# Patient Record
Sex: Male | Born: 1970 | Race: White | Hispanic: No | Marital: Single
Health system: Southern US, Community
[De-identification: ages and names within clinical notes are randomized; demographics above are authoritative.]

---

## 2010-08-07 ENCOUNTER — Emergency Department: Payer: Self-pay | Admitting: Emergency Medicine

## 2011-12-10 ENCOUNTER — Emergency Department: Payer: Self-pay | Admitting: Emergency Medicine

## 2012-03-16 ENCOUNTER — Inpatient Hospital Stay: Payer: Self-pay | Admitting: Specialist

## 2012-03-16 LAB — CBC
MCHC: 33.6 g/dL (ref 32.0–36.0)
Platelet: 289 10*3/uL (ref 150–440)
WBC: 9.4 10*3/uL (ref 3.8–10.6)

## 2012-03-16 LAB — URINALYSIS, COMPLETE
Bilirubin,UR: NEGATIVE
Blood: NEGATIVE
Leukocyte Esterase: NEGATIVE
Nitrite: NEGATIVE
Ph: 7 (ref 4.5–8.0)
Protein: NEGATIVE
Specific Gravity: 1.017 (ref 1.003–1.030)
Squamous Epithelial: NONE SEEN

## 2012-03-16 LAB — COMPREHENSIVE METABOLIC PANEL
Anion Gap: 9 (ref 7–16)
BUN: 9 mg/dL (ref 7–18)
Bilirubin,Total: 0.4 mg/dL (ref 0.2–1.0)
Calcium, Total: 8.9 mg/dL (ref 8.5–10.1)
Chloride: 105 mmol/L (ref 98–107)
EGFR (African American): >60
EGFR (Non-African Amer.): >60
SGOT(AST): 23 U/L (ref 15–37)
SGPT (ALT): 32 U/L
Total Protein: 7.2 g/dL (ref 6.4–8.2)

## 2012-03-16 LAB — TROPONIN I: Troponin-I: 0.03 ng/mL

## 2012-03-16 LAB — CK TOTAL AND CKMB (NOT AT ARMC)
CK, Total: 159 U/L (ref 35–232)
CK-MB: 0.7 ng/mL (ref 0.5–3.6)
CK-MB: 0.5 ng/mL (ref 0.5–3.6)

## 2012-03-17 LAB — HEMOGLOBIN A1C: Hemoglobin A1C: 5.7 % (ref 4.2–6.3)

## 2012-03-17 LAB — LIPID PANEL
Cholesterol: 160 mg/dL (ref 0–200)
HDL Cholesterol: 56 mg/dL (ref 40–60)
Ldl Cholesterol, Calc: 84 mg/dL (ref 0–100)
Triglycerides: 100 mg/dL (ref 0–200)
VLDL Cholesterol, Calc: 20 mg/dL (ref 5–40)

## 2012-03-17 LAB — CK TOTAL AND CKMB (NOT AT ARMC)
CK, Total: 135 U/L (ref 35–232)
CK-MB: 0.5 ng/mL (ref 0.5–3.6)

## 2012-03-17 LAB — GLUCOSE, RANDOM: Glucose: 95 mg/dL (ref 65–99)

## 2013-03-16 IMAGING — US US CAROTID DUPLEX BILAT
1 series · 14 of 24 positions shown · non-contrast
Comparison: none

REASON FOR EXAM: perioral and tongue numbness, ?mini-strokes
COMMENTS:

[Series 1: us carotid duplex bilat · 14 of 52 slices shown]
[im 1/52]
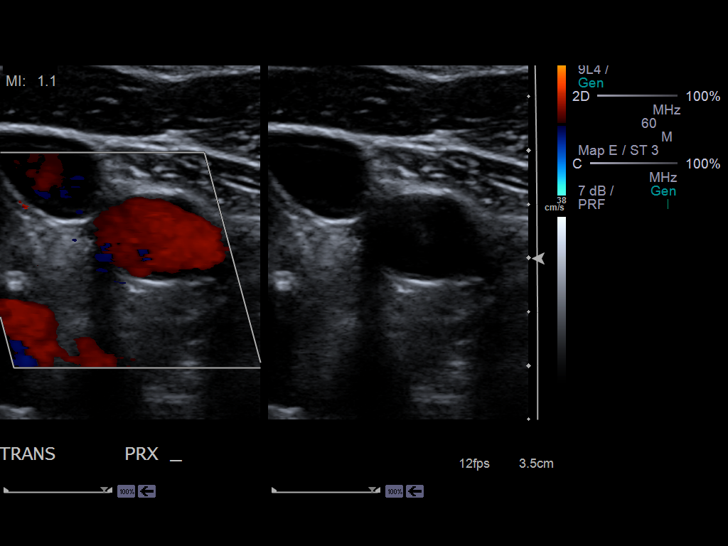
[im 5/52]
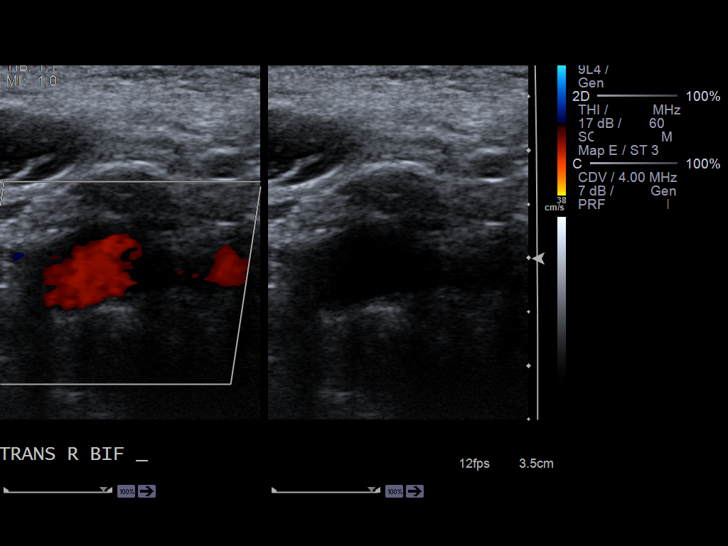
[im 9/52]
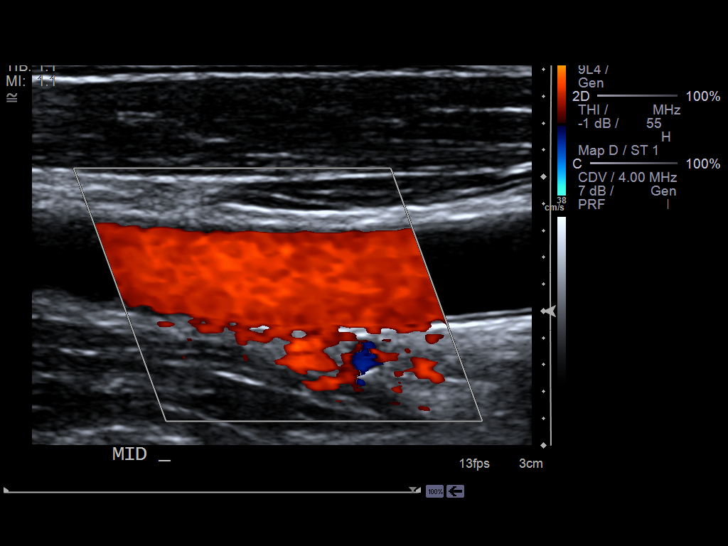
[im 14/52]
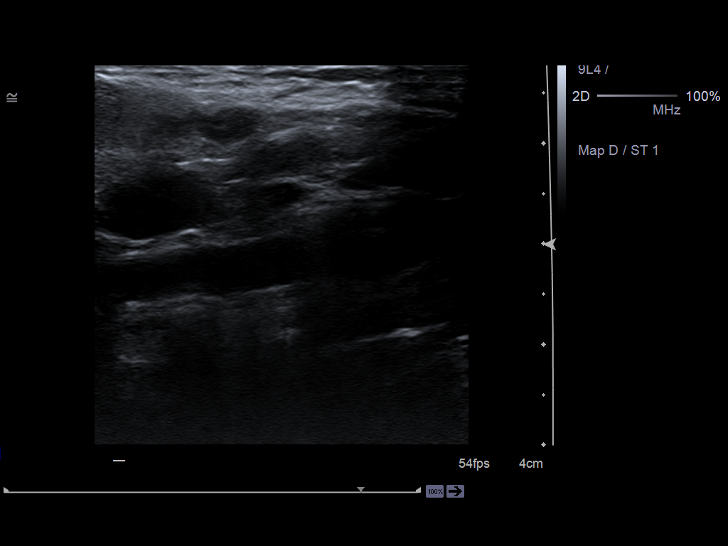
[im 16/52]
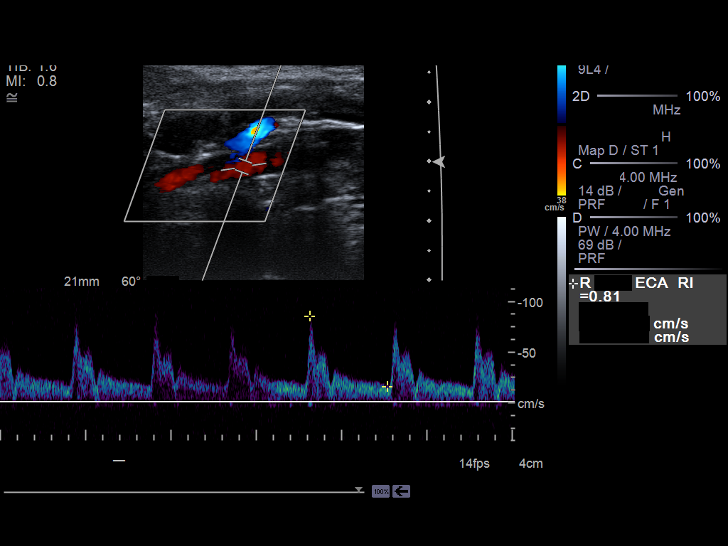
[im 20/52]
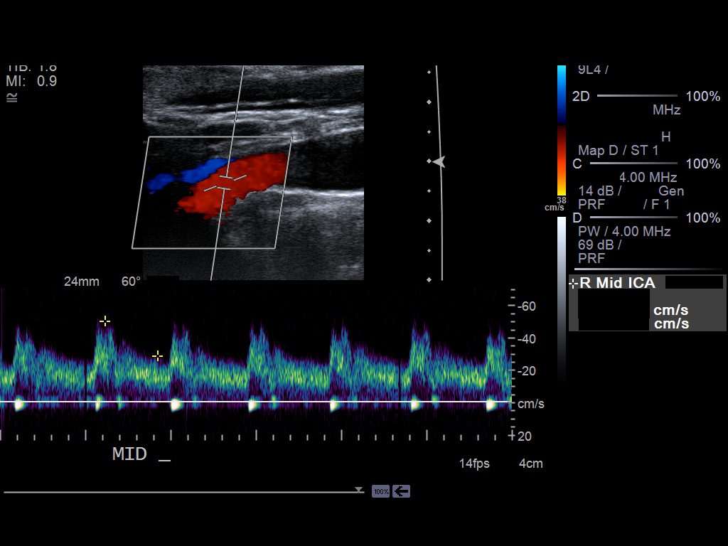
[im 25/52]
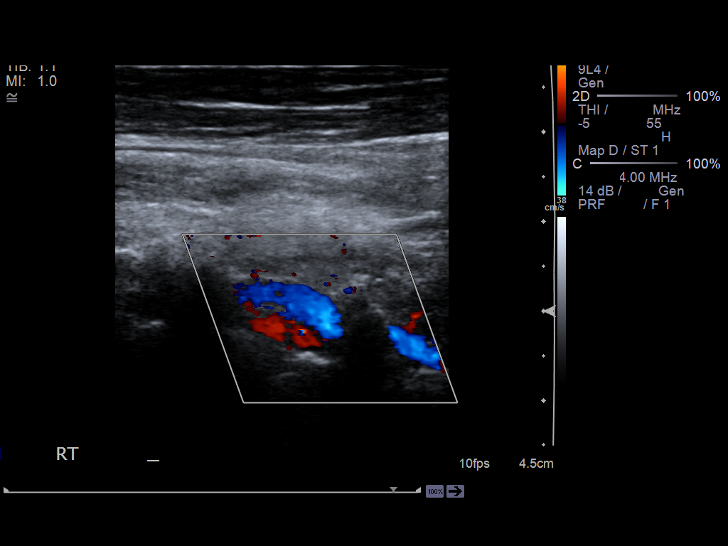
[im 27/52]
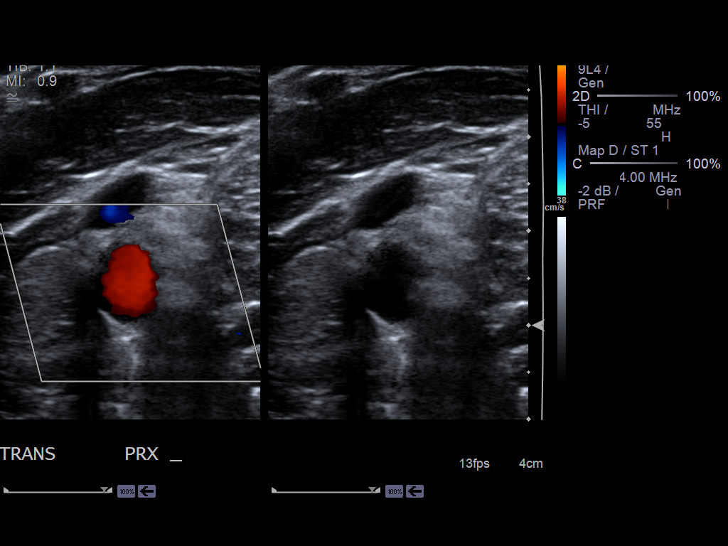
[im 32/52]
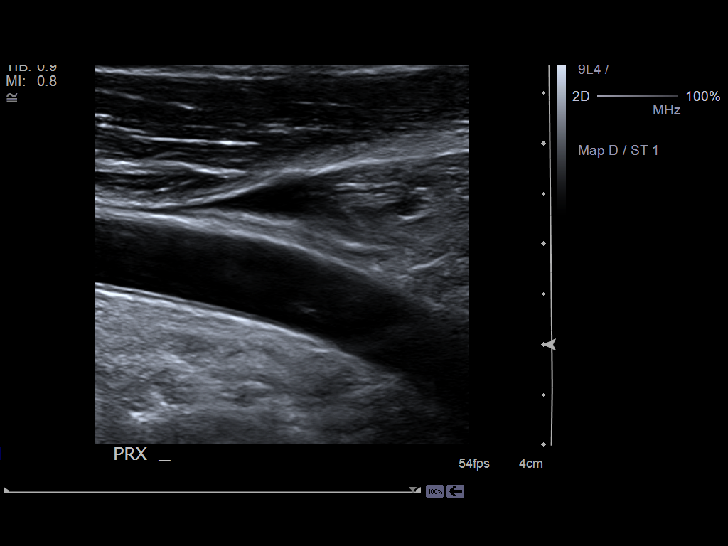
[im 36/52]
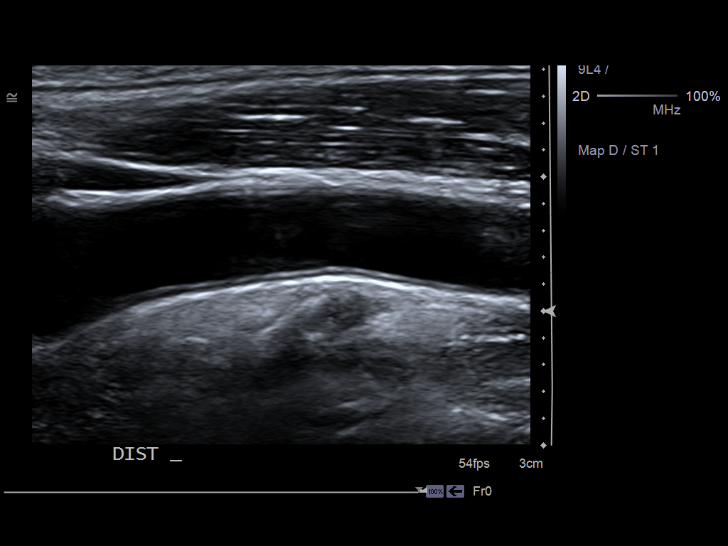
[im 40/52]
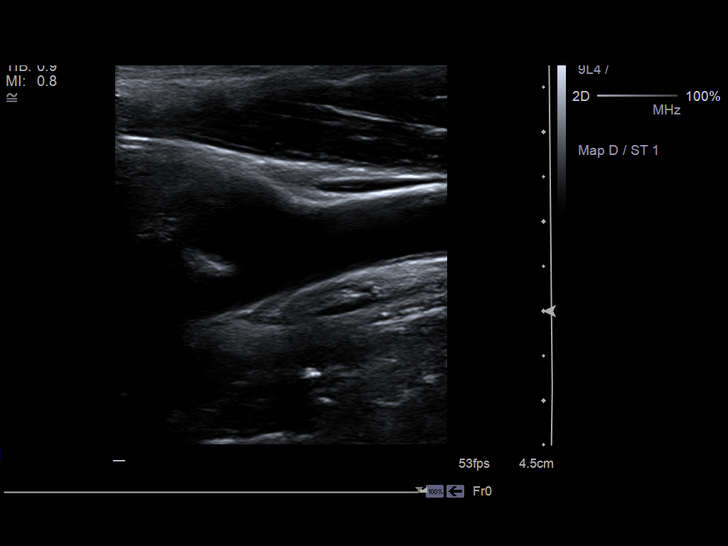
[im 43/52]
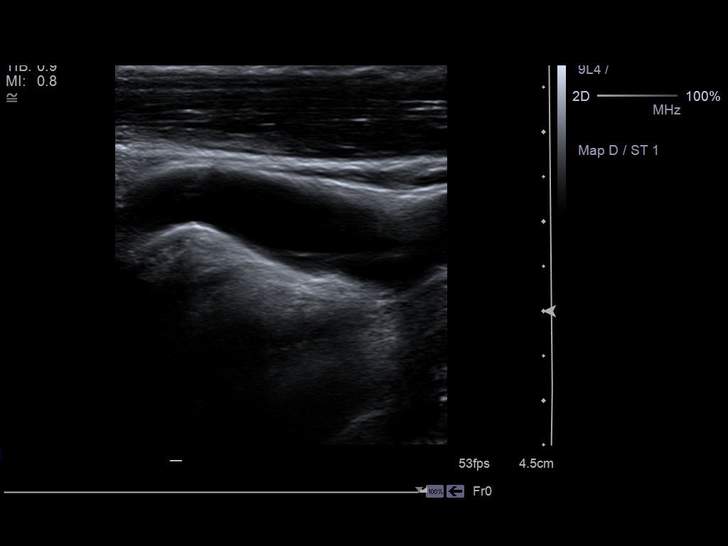
[im 47/52]
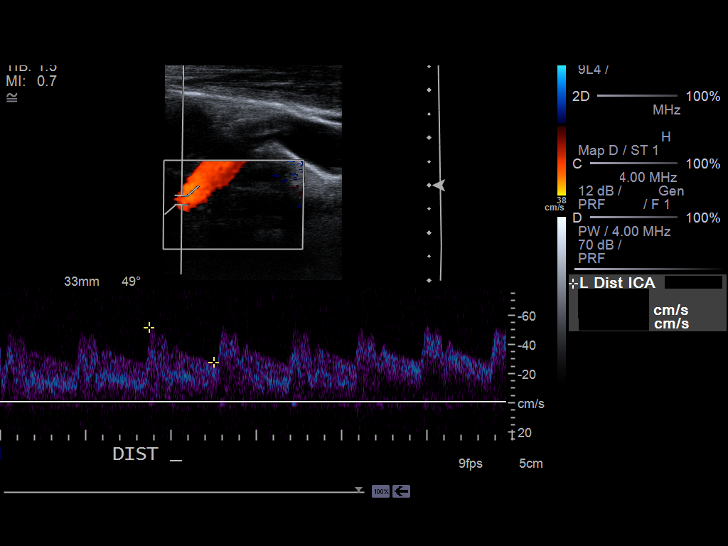
[im 52/52]
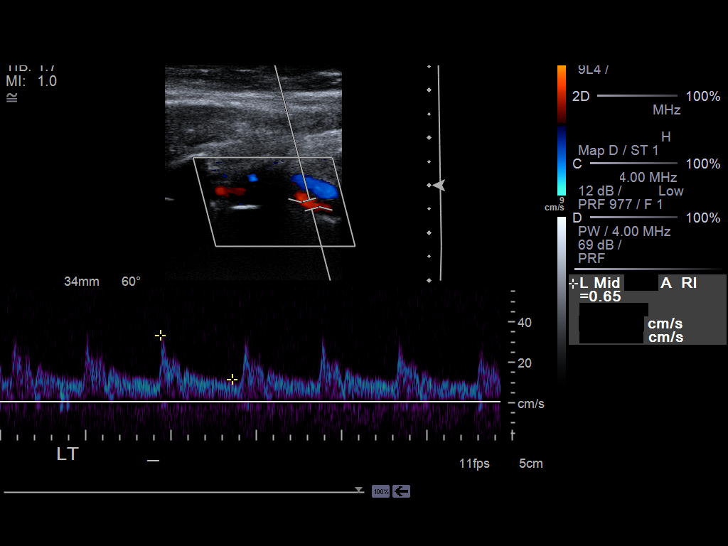

[14 of 24 positions shown; findings below may reference images not displayed]

PROCEDURE:     US  - US CAROTID DOPPLER BILATERAL  - March 17, 2012 [DATE]

RESULT:     Carotid Doppler interrogation demonstrates mild intimal
thickening without significant atherosclerotic plaque formation. Visually,
there is no significant stenosis. The color and SPECTRAL Doppler appearance
is normal bilaterally. Antegrade flow is noted in both vertebral arteries.
The peak systolic velocities are normal. The internal to common carotid peak
systolic velocity ratio is normal at 0.77 on the right and 0.66 on the left.
IMPRESSION: 1.     Atherosclerotic disease. No evidence of hemodynamically significant
stenosis.

[REDACTED]

## 2014-08-06 DEATH — deceased

## 2015-01-28 NOTE — Discharge Summary (Signed)
PATIENT NAME:  Connor Brady, [name] MR#:  [id number] DATE OF BIRTH:  03-Nov-1970  DATE OF ADMISSION:  03/16/2012 DATE OF DISCHARGE:  03/18/2012  The patient is being [facility] care.   DIAGNOSES AT DISCHARGE:  1. Cardiomyopathy with ejection fraction of 35 to 40%, single-vessel coronary artery disease likely requiring further intervention. Suspected transient ischemic attack, now ruled out.  2. History of tobacco abuse.  3. History of alcohol abuse.   MEDICATIONS: The patient is being discharged on the following medications:  1. Aspirin 81 mg daily. 2. Nicotine patch 21 mg transdermally daily.  3. Nitroglycerin patch 0.1 mg daily.  4. Zofran 4 mg every four hours p.r.n.  5. Metoprolol 25 mg b.i.d.  6. Lisinopril 5 mg daily.  7. Simvastatin 40 mg daily.   CONSULTANTS: Dr. Adrian BlackwaterShaukat Khan from cardiology.   PERTINENT STUDIES: CT scan of the head done without contrast showing no acute intracranial process. A chest x-ray done on admission showing no acute cardiopulmonary disease. MRI of the brain done without contrast showing no acute intracranial pathology. An ultrasound of the carotids showing no evidence of hemodynamically significant carotid stenosis. A nuclear medicine stress test done on 06/12 showing large anteroseptal wall myocardial infarction and severe LV dysfunction, but there is residual ischemia also in the right coronary artery territory. Advised cardiac catheterization. Cardiac catheterization done on 03/18/2012 showing bifurcation lesion of the LAD 99% at the bifurcation with very large diagonal. Left circumflex and RCA are okay with some collaterals from the RCA to the LAD. Left ventricular ejection fraction is 35% with anteroapical akinesis. I spoke [facility] and likely will do high risk PCI tomorrow after transfer today.   HOSPITAL COURSE: This is a 44 year old male with medical problems as mentioned above who  presented to the hospital on 03/16/2012 due to chest pain with abnormal EKG.  1. Chest pain with abnormal EKG. The patient was admitted to the hospital and was placed on telemetry, had three sets of cardiac markers checked which were negative. Underwent a stress test which showed severe LV dysfunction along with anterior wall reversible ischemia. The patient underwent a cardiac catheterization, the results of which are stated above. As per further discussion with cardiology, the patient likely needs high risk PCI and, therefore, is being transferred to [facility] for further care. The patient currently is chest pain free and hemodynamically stable.  2. Suspected transient ischemic attack. The patient presented to the hospital with some perioral numbness and tingling. This was suspicious for possible transient ischemic attack. The patient underwent an extensive work-up including a CT head, MRI of the brain, carotid duplex which was all within normal limits. His symptoms have since then resolved.  3. Tobacco abuse: The patient was strongly advised to quit smoking and is currently being maintained a nicotine patch.  4. History of alcohol abuse. The patient has been maintained on a CIWA protocol, but has not shown any signs of alcohol withdrawal.   DISPOSITION: The patient is being discharged to [facility] for high risk PCI as mentioned.   TIME SPENT: 40 minutes.  ____________________________ Connor PancakeVivek J. Cherlynn KaiserSainani, MD vjs:ap D: 03/18/2012 14:14:18 ET T: 03/18/2012 14:26:42 ET JOB#: [id number]  cc: Connor PancakeVivek J. Cherlynn KaiserSainani, MD, <Dictator> Houston SirenVIVEK J Connor Dockter MD ELECTRONICALLY SIGNED 03/26/2012 16:56

## 2015-01-28 NOTE — H&P (Signed)
PATIENT NAME:  Connor Brady, SABOL MR#:  [id number] DATE OF BIRTH:  [birth year]  DATE OF ADMISSION:  03/16/2012  REFERRING PHYSICIAN: Dr. Shaune Pollack PRIMARY CARE PHYSICIAN: None   CHIEF COMPLAINT: "I had pain in my chest."  HISTORY OF PRESENT ILLNESS: Patient is a 44 year old male with past medical history as listed below including tobacco and alcohol abuse, ongoing who presents to the Emergency Department with the above-mentioned chief complaint. Patient states he has had chest pain intermittently over the past couple of months but yesterday he had constant chest pain for which he came to the ER today for further evaluation. He reports pain in his chest in the substernal location which developed while he was exerting himself and installing an air conditioning system and chest pain radiating to the left arm and also associated with numbness for the last two digits of the left hand, also associated with shortness of breath and some diaphoresis. Initial chest elevated was 8/10 in intensity. Chest pain lasted for a couple of hours today and resolved on its own in the ER. He is currently chest pain free at the time of my evaluation. He also reports periods of perioral and tongue and lip tingling and numbness over the past couple of months associated with visual floaters and seeing squiggly lines even while he is driving to the point where he has to pull off the road. Otherwise, he is without specific complaints at this time. He came to the ER for further evaluation. EKG was obtained revealing some anterolateral T wave inversions with no prior EKGs for comparison. His first set of cardiac enzymes are negative. Thereafter, hospitalist services were contacted for further evaluation and for hospital admission.   PAST SURGICAL HISTORY: Tubes in his ears, otherwise no past surgeries.   PAST MEDICAL HISTORY:  1. Tobacco abuse, ongoing.  2. Alcohol abuse, ongoing.  3. History of childhood hyperglycemia but apparently  never diagnosed with diabetes mellitus.   ALLERGIES: Codeine causes swelling.   HOME MEDICATIONS: None. He takes no medications.   FAMILY HISTORY: Mother alive, history of cardiac arrhythmia, fibromyalgia, scoliosis, osteoarthritis, history of congestive heart failure. Biological father's history is unknown. Strong family history of coronary artery disease and myocardial infarction on patient's maternal side.   SOCIAL HISTORY: Tobacco-Ongoing, smokes 1.5 packs per day, has been smoking since age 87. Alcohol-Drinks daily at least a 6-pack of beer a day. Was drinking heavier in the past, has been drinking for multiple years. Illicit drugs-None. Patient lives at home with his girlfriend. 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   REVIEW OF SYSTEMS: CONSTITUTIONAL: Denies fevers, chills, recent changes in weight or weakness. Denies fatigue. Had some chest pain earlier which has since resolved. HEAD/EYES: Occasionally sees visual floaters and squiggly lines. Denies headache. Currently denies blurry vision. ENT: Denies tinnitus, earache, nasal discharge, or sore throat. RESPIRATORY: Had some shortness of breath associated with chest pain which is currently resolved. Denies cough or wheezing. CARDIOVASCULAR: Had some chest pain earlier which has since resolved. Denies heart palpitations or lower extremity edema. GASTROINTESTINAL: Denies nausea, vomiting, diarrhea, constipation, melena, hematochezia or abdominal pains. GENITOURINARY: Denies dysuria, hematuria. ENDOCRINE: Denies heat or cold intolerance. HEME/LYMPH: Denies easy bruising or bleeding. INTEGUMENTARY: Denies rash or lesions.  MUSCULOSKELETAL: Denies back pain or muscle weakness. NEUROLOGIC: Has some perioral and tongue and lip numbness and tingling at times as well as some numbness involving last two digits of the left hand earlier today associated with chest pain and also seeing visual  floaters and squiggly lines. Denies headache  or any weakness or dysarthria or seizures or ataxia. PSYCHIATRIC: Denies depression or anxiety. Denies suicidal or homicidal ideation.   PHYSICAL EXAMINATION:  VITAL SIGNS: Temperature 97.6, pulse 84, blood pressure 126/83, respirations 18, oxygen saturation 98% on room air.   GENERAL: Patient alert and oriented, not acutely distressed.   HEENT: Normocephalic, atraumatic. Pupils are equal, round and reactive to light accommodation. Extraocular muscles are intact. Anicteric sclerae. Conjunctivae pink. Hearing intact to voice. Nares without drainage. Oral mucosa moist without lesions.   NECK: Supple with full range of motion. No jugular venous distention, lymphadenopathy, or carotid bruits bilaterally. No thyromegaly or tenderness to palpation over thyroid gland.   CHEST: Normal respiratory effort without use of accessory respiratory muscles. Lungs are clear to auscultation bilaterally without crackles, rales, or wheezing.   CARDIOVASCULAR: S1, S2 positive. Regular rate and rhythm. No murmurs, rubs, or gallops. PMI is non-lateralized. No tenderness to palpitation over anterior chest wall.   ABDOMEN: Soft, nontender, nondistended. Normoactive bowel sounds. No hepatosplenomegaly or palpable masses. No hernias.   EXTREMITIES: No clubbing, cyanosis, or edema. Pedal pulses are palpable bilaterally.   SKIN: No suspicious rashes. Skin turgor is good.   LYMPH: No cervical lymphadenopathy.    NEUROLOGIC: Alert and oriented x3. Cranial nerves II through XII are grossly intact. No focal deficits. Sensation is intact to light touch throughout and facial sensation is intact as well. No facial droop. Speech is clear. Facial strength is intact. Motor strength 5/5 in upper and lower extremities bilaterally. Babinski sign absent bilaterally with toes downgoing. Speech is clear. No facial droop. Concentration, attention, recent/remote memory are intact. Finger to nose testing intact bilaterally. No focal  deficits.   PSYCH: Pleasant male with appropriate affect.   LABORATORY, DIAGNOSTIC, AND RADIOLOGICAL DATA:  Complete metabolic panel within normal limits.   Cardiac enzymes negative with CK 205, CK-MB 0.7, troponin 0.03.   CBC within normal limits except for MCV elevated at 102. WBC, hemoglobin, hematocrit and platelet counts are within normal limits.   Urinalysis was benign.   EKG normal sinus rhythm, heart rate 90 beats per minute with normal axis, normal intervals. He has got some anterolateral T wave inversions in leads V4 and V5. No prior EKGs for comparison. No ST elevations.   Portable chest x-ray: No acute cardiopulmonary abnormalities. This is a preliminary report.   ASSESSMENT AND PLAN: 44 year old male with past medical history of extensive tobacco abuse which is ongoing, history of alcohol abuse, strong family history of cardiac disease and myocardial infarction and coronary artery disease here with chest pain and also occasional perioral and tongue/lip numbness and seeing visual impairment with seeing visual floaters and squiggly lines at times with:   1. Chest pain with abnormal EKG-With some typical features of his chest pain. Risk factors for coronary artery disease include extensive tobacco abuse as well as strong family history of premature coronary artery disease and myocardial infarction. Will admit patient to telemetry unit for further evaluation and management. Will rule out myocardial infarction but continue to cycle his cardiac enzymes and first set is negative. Will start patient on oxygen, aspirin therapy and since he has an abnormal EKG with inverted T waves will also start him on Lovenox. Also start nitrate therapy. Consider beta blocker if his blood pressure will allow. Will check fasting lipid profile and proceed with Myoview (exercise tolerance test) if he rules out for myocardial infarction. Further work-up and management to follow depending on patient's clinical  course.  2. Perioral/tongue numbness along with visual floaters-Not sure if these represent mini strokes. He does have risk factors for cerebrovascular disease as well given extensive tobacco abuse for which we will obtain a head CT and thereafter proceed with brain MRI, 2-D echocardiogram, carotid artery Doppler ultrasound and obtain frequent neuro checks. Also start aspirin therapy as above as long as head CT is negative for bleed. Check lipid profile and start statin therapy if indicated. Further work-up and management to follow depending on patient's clinical course.  3. Tobacco abuse-Patient was strongly counseled on the importance of smoking cessation, approximately five minutes were spent in doing so, will place nicotine patch and order Nicotrol inhaler as per patient's desire.  4. Alcohol abuse-Patient strongly counseled on the importance of maintaining sobriety. He states sometimes he wakes up in the morning and develops shakes when he stops drinking abruptly; therefore, will keep him on CIWA for alcohol withdrawal and DT prophylaxis and also keep him on thiamine, folate, and multivitamin therapy and monitor his overall clinical status closely. Primary team to consider substance abuse consultation.  5. Deep vein thrombosis prophylaxis-Lovenox.  6. CODE STATUS: FULL CODE.   TIME SPENT ON THIS ADMISSION: Approximately 50 minutes.   ____________________________ Elon Alas, MD knl:cms D: 03/16/2012 19:13:23 ET T: 03/17/2012 07:26:38 ET  JOB#: [id number] N Cecil Vandyke MD ELECTRONICALLY SIGNED 04/06/2012 1:18

## 2015-01-28 NOTE — Consult Note (Signed)
PATIENT NAME:  Connor Brady, Connor Brady MR#:  [id number] DATE OF BIRTH:  Jun 29, 1971  DATE OF CONSULTATION:  03/17/2012  REFERRING PHYSICIAN:   CONSULTING PHYSICIAN:  Laurier NancyShaukat A. Fahd Galea, MD  INDICATION FOR CONSULTATION: Chest pain.   HISTORY OF PRESENT ILLNESS: This is a 44 year old white male with a past medical history of alcohol abuse who presented to the emergency room with chest pain. He has been having chest pain for the past three months intermittently. He refused to come to the hospital and refused to see physicians. His mother forced him to come to the emergency room when he had severe episode of chest pain. His baseline EKG revealed normal sinus rhythm with some nonspecific ST-T changes, except in lead V3 which had 1 mm ST elevation.   PAST MEDICAL HISTORY:  1. History of alcohol abuse. 2. History of tobacco use.  3. History of hyperglycemia.  ALLERGIES: Codeine.   MEDICATIONS: He does not take any home medication.   FAMILY HISTORY: Mother has cardiac arrhythmia and fibromyalgia. He has a strong family history of coronary artery disease and myocardial infarction.   SOCIAL HISTORY: He smokes 1-1/2 packs per day since age 44. He drinks a 6 pack of beer a day.   PHYSICAL EXAMINATION:   GENERAL: He is alert and oriented x3, in no acute distress.   VITAL SIGNS: Stable.   NECK: No JVD.   LUNGS: Clear.   HEART: Regular rate and rhythm. Normal S1 and S2. No audible murmur.   ABDOMEN: Soft and nontender, positive bowel sounds.   EXTREMITIES: No pedal edema.   DIAGNOSTIC DATA: EKG shows normal sinus rhythm, 90 beats per minute, nonspecific ST-T changes with some ST elevation in lead V3.   ASSESSMENT AND PLAN: The patient had chest pain. Myocardial infarction has been ruled out, but his stress test shows ejection fraction of 37% and fixed septal, anterior, and apical wall defect with some reversibility. Echocardiogram also shows septal severe hypokinesis and septal anterior wall  hypokinesis with EF 40%. I advise cardiac catheterization. The patient is reluctant to have cardiac catheterization. He very anxious. The family is going to talk to him and then we will decide whether he wants to have cardiac catheterization.   Thank you very much for the referral.  ____________________________ Laurier NancyShaukat A. Braxson Hollingsworth, MD sak:slb D: 03/17/2012 16:49:43 ET T: 03/17/2012 17:11:02 ET JOB#: [id number]  cc: Laurier NancyShaukat A. Draylen Lobue, MD, <Dictator> Laurier NancySHAUKAT A Blakely Maranan MD ELECTRONICALLY SIGNED 03/22/2012 8:13
